# Patient Record
Sex: Female | Born: 2005 | Race: Black or African American | Hispanic: No | Marital: Single | Smoking: Never smoker
Health system: Southern US, Community
[De-identification: ages and names within clinical notes are randomized; demographics above are authoritative.]

## PROBLEM LIST (undated history)

## (undated) DIAGNOSIS — J45909 Unspecified asthma, uncomplicated: Secondary | ICD-10-CM

## (undated) DIAGNOSIS — T7840XA Allergy, unspecified, initial encounter: Secondary | ICD-10-CM

## (undated) HISTORY — DX: Unspecified asthma, uncomplicated: J45.909

## (undated) HISTORY — DX: Allergy, unspecified, initial encounter: T78.40XA

---
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
## 2024-12-01 ENCOUNTER — Encounter: Payer: Self-pay | Admitting: Pediatrics

## 2024-12-01 ENCOUNTER — Ambulatory Visit: Admitting: Pediatrics

## 2024-12-01 VITALS — BP 120/82 | Ht 63.54 in | Wt 169.0 lb

## 2024-12-01 DIAGNOSIS — Z113 Encounter for screening for infections with a predominantly sexual mode of transmission: Secondary | ICD-10-CM

## 2024-12-01 DIAGNOSIS — Z68.41 Body mass index (BMI) pediatric, 85th percentile to less than 95th percentile for age: Secondary | ICD-10-CM

## 2024-12-01 DIAGNOSIS — Z0000 Encounter for general adult medical examination without abnormal findings: Secondary | ICD-10-CM | POA: Diagnosis not present

## 2024-12-01 NOTE — Progress Notes (Signed)
 Adolescent Well Care Visit Molly Jenkins is a 18 y.o. female who is here for well care.    PCP:  Azell Dannielle SAUNDERS, MD   History was provided by the patient.  Confidentiality was discussed with the patient and, if applicable, with caregiver as well. Patient's personal or confidential phone number: [phone] pt's cell   Current Issues: Current concerns include none.   Nutrition: Nutrition/Eating Behaviors: Regular diet Adequate calcium in diet?: no Supplements/ Vitamins: Vit D  Exercise/ Media: Play any Sports?/ Exercise: no, walk campus Screen Time:  > 2 hours-counseling provided Media Rules or Monitoring?: no, pt >18yo, in college  Sleep:  Sleep: no concerns  Social Screening: Lives with:  on campus w/ roommates Parental relations:  good Activities, Work, and Regulatory Affairs Officer?: goes to college Concerns regarding behavior with peers?  no Stressors of note: no  Education: [employer] Name: [employer]  Major: Business, planning to double major in sports Regulatory Affairs Officer: doing well; no concerns School Behavior: doing well; no concerns  Menstruation:   No LMP recorded. Menstrual History: LMP- Oct 2025.  Pt currently on Nexplanon.    Confidential Social History: Tobacco?  no Secondhand smoke exposure?  no Drugs/ETOH?  no  Sexually Active?  Yes, males only Pregnancy Prevention: uses protection  Safe at home, in school & in relationships?  Yes Safe to self?  Yes   Screenings: Patient has a dental home: yes,   The patient completed the Rapid Assessment for Adolescent Preventive Services screening questionnaire and the following topics were identified as risk factors and discussed: condom use  In addition, the following topics were discussed as part of anticipatory guidance condom use.  PHQ-9 completed and results indicated: no concerns  Physical Exam:  Vitals:   12/01/24 1034  BP: 120/82  Weight: 169 lb (76.7 kg)  Height: 5' 3.54 (1.614 m)   BP  120/82 (BP Location: Left Arm, Patient Position: Sitting, Cuff Size: Normal)   Ht 5' 3.54 (1.614 m)   Wt 169 lb (76.7 kg)   BMI 29.43 kg/m  Body mass index: body mass index is 29.43 kg/m. Blood pressure %iles are not available for patients who are 18 years or older.  Hearing Screening  Method: Audiometry   500Hz  1000Hz  2000Hz  4000Hz   Right ear 20 20 20 20   Left ear 20 20 20 20    Vision Screening   Right eye Left eye Both eyes  Without correction     With correction 20/20 20/20 20/20     General Appearance:   alert, oriented, no acute distress and well nourished  HENT: Normocephalic, no obvious abnormality, conjunctiva clear  Mouth:   Normal appearing teeth, no obvious discoloration, dental caries, or dental caps  Neck:   Supple; thyroid: no enlargement, symmetric, no tenderness/mass/nodules  Chest TS 5  Lungs:   Clear to auscultation bilaterally, normal work of breathing  Heart:   Regular rate and rhythm, S1 and S2 normal, no murmurs;   Abdomen:   Soft, non-tender, no mass, or organomegaly  GU normal female external genitalia, pelvic not performed  Musculoskeletal:   Tone and strength strong and symmetrical, all extremities               Lymphatic:   No cervical adenopathy  Skin/Hair/Nails:   Skin warm, dry and intact, no rashes, no bruises or petechiae  Neurologic:   Strength, gait, and coordination normal and age-appropriate     Assessment and Plan:   18yo here for well adolescent exam  BMI is  not appropriate for age  Hearing screening result:normal Vision screening result: normal  Counseling provided for all of the vaccine components No orders of the defined types were placed in this encounter.    Return in 1 year (on 12/01/2025)..  Denyla Cortese R Jamani Eley, MD
